# Patient Record
Sex: Female | Born: 1965 | Race: Black or African American | Hispanic: No | Smoking: Current every day smoker
Health system: Southern US, Community
[De-identification: ages and names within clinical notes are randomized; demographics above are authoritative.]

## PROBLEM LIST (undated history)

## (undated) DIAGNOSIS — K5792 Diverticulitis of intestine, part unspecified, without perforation or abscess without bleeding: Secondary | ICD-10-CM

## (undated) DIAGNOSIS — E119 Type 2 diabetes mellitus without complications: Secondary | ICD-10-CM

## (undated) DIAGNOSIS — I10 Essential (primary) hypertension: Secondary | ICD-10-CM

## (undated) HISTORY — PX: CHOLECYSTECTOMY: SHX55

---

## 2021-04-26 ENCOUNTER — Emergency Department (HOSPITAL_BASED_OUTPATIENT_CLINIC_OR_DEPARTMENT_OTHER)
Admission: EM | Admit: 2021-04-26 | Discharge: 2021-04-26 | Disposition: A | Discharge status: Home / Self Care | Payer: BC Managed Care – PPO | Attending: Emergency Medicine | Admitting: Emergency Medicine

## 2021-04-26 ENCOUNTER — Other Ambulatory Visit: Payer: Self-pay

## 2021-04-26 ENCOUNTER — Emergency Department (HOSPITAL_BASED_OUTPATIENT_CLINIC_OR_DEPARTMENT_OTHER): Payer: BC Managed Care – PPO

## 2021-04-26 ENCOUNTER — Encounter (HOSPITAL_BASED_OUTPATIENT_CLINIC_OR_DEPARTMENT_OTHER): Payer: Self-pay | Admitting: Emergency Medicine

## 2021-04-26 DIAGNOSIS — E119 Type 2 diabetes mellitus without complications: Secondary | ICD-10-CM | POA: Insufficient documentation

## 2021-04-26 DIAGNOSIS — F17210 Nicotine dependence, cigarettes, uncomplicated: Secondary | ICD-10-CM | POA: Diagnosis not present

## 2021-04-26 DIAGNOSIS — K5792 Diverticulitis of intestine, part unspecified, without perforation or abscess without bleeding: Secondary | ICD-10-CM

## 2021-04-26 DIAGNOSIS — K5732 Diverticulitis of large intestine without perforation or abscess without bleeding: Secondary | ICD-10-CM | POA: Insufficient documentation

## 2021-04-26 DIAGNOSIS — I10 Essential (primary) hypertension: Secondary | ICD-10-CM | POA: Insufficient documentation

## 2021-04-26 DIAGNOSIS — R1032 Left lower quadrant pain: Secondary | ICD-10-CM | POA: Diagnosis present

## 2021-04-26 HISTORY — DX: Essential (primary) hypertension: I10

## 2021-04-26 HISTORY — DX: Type 2 diabetes mellitus without complications: E11.9

## 2021-04-26 LAB — URINALYSIS, MICROSCOPIC (REFLEX)

## 2021-04-26 LAB — CBC WITH DIFFERENTIAL/PLATELET
Abs Immature Granulocytes: 0.01 10*3/uL (ref 0.00–0.07)
Basophils Absolute: 0 10*3/uL (ref 0.0–0.1)
Basophils Relative: 0 %
Eosinophils Absolute: 0 10*3/uL (ref 0.0–0.5)
Eosinophils Relative: 0 %
HCT: 41.7 % (ref 36.0–46.0)
Hemoglobin: 13.9 g/dL (ref 12.0–15.0)
Immature Granulocytes: 0 %
Lymphocytes Relative: 34 %
Lymphs Abs: 1.8 10*3/uL (ref 0.7–4.0)
MCH: 31.2 pg (ref 26.0–34.0)
MCHC: 33.3 g/dL (ref 30.0–36.0)
MCV: 93.7 fL (ref 80.0–100.0)
Monocytes Absolute: 0.4 10*3/uL (ref 0.1–1.0)
Monocytes Relative: 7 %
Neutro Abs: 3 10*3/uL (ref 1.7–7.7)
Neutrophils Relative %: 59 %
Platelets: 288 10*3/uL (ref 150–400)
RBC: 4.45 MIL/uL (ref 3.87–5.11)
RDW: 12.8 % (ref 11.5–15.5)
WBC: 5.2 10*3/uL (ref 4.0–10.5)
nRBC: 0 % (ref 0.0–0.2)

## 2021-04-26 LAB — COMPREHENSIVE METABOLIC PANEL
ALT: 7 U/L (ref 0–44)
AST: 18 U/L (ref 15–41)
Albumin: 3.9 g/dL (ref 3.5–5.0)
Alkaline Phosphatase: 54 U/L (ref 38–126)
Anion gap: 7 (ref 5–15)
BUN: 7 mg/dL (ref 6–20)
CO2: 27 mmol/L (ref 22–32)
Calcium: 9.4 mg/dL (ref 8.9–10.3)
Chloride: 107 mmol/L (ref 98–111)
Creatinine, Ser: 0.74 mg/dL (ref 0.44–1.00)
GFR, Estimated: >60 mL/min (ref >60)
Glucose, Bld: 92 mg/dL (ref 70–99)
Potassium: 4.2 mmol/L (ref 3.5–5.1)
Sodium: 141 mmol/L (ref 135–145)
Total Bilirubin: 0.8 mg/dL (ref 0.3–1.2)
Total Protein: 7.8 g/dL (ref 6.5–8.1)

## 2021-04-26 LAB — URINALYSIS, ROUTINE W REFLEX MICROSCOPIC
Bilirubin Urine: NEGATIVE
Glucose, UA: NEGATIVE mg/dL
Ketones, ur: NEGATIVE mg/dL
Leukocytes,Ua: NEGATIVE
Nitrite: NEGATIVE
Protein, ur: NEGATIVE mg/dL
Specific Gravity, Urine: 1.025 (ref 1.005–1.030)
pH: 5.5 (ref 5.0–8.0)

## 2021-04-26 LAB — LIPASE, BLOOD: Lipase: 34 U/L (ref 11–51)

## 2021-04-26 MED ORDER — HYDROCODONE-ACETAMINOPHEN 5-325 MG PO TABS: 1.0000 | ORAL_TABLET | ORAL | 0 refills | Status: AC | PRN

## 2021-04-26 MED ORDER — AMOXICILLIN-POT CLAVULANATE 875-125 MG PO TABS: 1.0000 | ORAL_TABLET | Freq: Two times a day (BID) | ORAL | 0 refills | Status: AC

## 2021-04-26 NOTE — Discharge Instructions (Addendum)
We discussed the results of your CT abdomen and pelvis on today's visit.  Please be aware the incidental findings that we discussed, you will need to have follow-up for the lung nodule found on today's visit.  Please continue with your gastroenterology follow-up, as recommended.  I did prescribe an antibiotic to help treat your infection, due to your allergy to Cipro.  We we will treated with 1 antibiotic, please take 1 tablet twice daily for the next 7 days.  I have also prescribed medication to help with severe pain, please be aware this medication can cause constipation, you will need to add a stool softener or MiraLAX daily while taking this medication.  If you experience any fever, nausea, vomiting, blood in your stools you will need to return to the emergency department.

## 2021-04-26 NOTE — ED Triage Notes (Signed)
Pt arrives pov with LLQ pain and diarrhea that started yesterday and hematuria. Pt referred to ED by pcp for CT scan. Pt denies fever or emesis, endorses nausea. Pt endorses 500mg  tylenol pta

## 2021-04-26 NOTE — ED Provider Notes (Signed)
[facility] EMERGENCY DEPARTMENT Provider Note   CSN: [id number] Arrival date & time: 04/26/21  1021     History Chief Complaint  Patient presents with   Abdominal Pain    Erin Pace is a 55 y.o. female.  55 y.o female with a PMH of DM, HTN presents to the ED with a chief complaint of left lower quadrant abdominal pain x yesterday.  Patient was evaluated at [facility] this morning, was sent here for further evaluation of her left lower quadrant pain that began yesterday.  Reports taking Tylenol yesterday for her pain which made the pain go from an 8 to "really like ".  Reports that when she woke up this morning it was not as bad.  She had similar symptoms about 10 years ago, unsure whether these were caused by any diverticulitis.  Endorses some nausea.  Also complains of some diarrhea, states today while she was doing a urine sample, had an episode of diarrhea again prior to arrival in the ED.  She has not had any emesis.  There has been no blood in her stool, no sick contacts, no fever, no shortness of breath or chest pain.  No urinary symptoms.      The history is provided by the patient.  Abdominal Pain Pain location:  LLQ Pain quality: burning   Pain radiates to:  Does not radiate Pain severity:  Severe Onset quality:  Sudden Duration:  1 day Timing:  Constant Progression:  Worsening Chronicity:  New Relieved by:  Acetaminophen Worsened by:  Palpation Associated symptoms: nausea   Associated symptoms: no chest pain, no chills, no fever, no shortness of breath, no sore throat and no vomiting       Past Medical History:  Diagnosis Date   Diabetes mellitus without complication (HCC)    Hypertension     There are no problems to display for this patient.   Past Surgical History:  Procedure Laterality Date   CHOLECYSTECTOMY       OB History   No obstetric history on file.     History reviewed. No pertinent family history.  Social  History   Tobacco Use   Smoking status: Every Day    Pack years: 0.00    Types: Cigarettes  Substance Use Topics   Alcohol use: Yes   Drug use: Never    Home Medications Prior to Admission medications   Medication Sig Start Date End Date Taking? Authorizing Provider  amoxicillin-clavulanate (AUGMENTIN) 875-125 MG tablet Take 1 tablet by mouth 2 (two) times daily for 7 days. 04/26/21 05/03/21 Yes Latika Kronick, Leonie Douglas, PA-C  HYDROcodone-acetaminophen (NORCO/VICODIN) 5-325 MG tablet Take 1 tablet by mouth every 4 (four) hours as needed for up to 3 days. 04/26/21 04/29/21 Yes Klyn Kroening, Leonie Douglas, PA-C    Allergies    Ciprofloxacin and Sulfa antibiotics  Review of Systems   Review of Systems  Constitutional:  Negative for chills and fever.  HENT:  Negative for sore throat.   Respiratory:  Negative for shortness of breath.   Cardiovascular:  Negative for chest pain.  Gastrointestinal:  Positive for abdominal pain and nausea. Negative for vomiting.  Genitourinary:  Negative for flank pain.  Musculoskeletal:  Negative for back pain.  Neurological:  Negative for light-headedness and headaches.  All other systems reviewed and are negative.  Physical Exam Updated Vital Signs BP (!) 149/94 (BP Location: Right Arm)   Pulse 73   Temp 98.6 F (37 C) (Oral)   Resp 18  Ht 5\' 2"  (1.575 m)   Wt 48.3 kg   SpO2 100%   BMI 19.46 kg/m   Physical Exam Vitals and nursing note reviewed.  Constitutional:      Appearance: She is well-developed.  HENT:     Head: Normocephalic and atraumatic.  Cardiovascular:     Rate and Rhythm: Normal rate.  Pulmonary:     Effort: Pulmonary effort is normal.     Breath sounds: No wheezing or rales.  Abdominal:     General: Abdomen is flat. Bowel sounds are normal.     Palpations: Abdomen is soft.     Tenderness: There is abdominal tenderness in the left lower quadrant. There is no left CVA tenderness.  Skin:    General: Skin is warm and dry.  Neurological:      Mental Status: She is alert and oriented to person, place, and time.    ED Results / Procedures / Treatments   Labs (all labs ordered are listed, but only abnormal results are displayed) Labs Reviewed  URINALYSIS, ROUTINE W REFLEX MICROSCOPIC - Abnormal; Notable for the following components:      Result Value   Hgb urine dipstick TRACE (*)    All other components within normal limits  URINALYSIS, MICROSCOPIC (REFLEX) - Abnormal; Notable for the following components:   Bacteria, UA RARE (*)    All other components within normal limits  CBC WITH DIFFERENTIAL/PLATELET  COMPREHENSIVE METABOLIC PANEL  LIPASE, BLOOD    EKG None  Radiology CT ABDOMEN PELVIS WO CONTRAST  Result Date: 04/26/2021 CLINICAL DATA:  LEFT lower quadrant pain EXAM: CT ABDOMEN AND PELVIS WITHOUT CONTRAST TECHNIQUE: Multidetector CT imaging of the abdomen and pelvis was performed following the standard protocol without IV contrast. COMPARISON:  None. FINDINGS: Evaluation is limited secondary lack of IV contrast. Lower chest: RIGHT lower lobe pulmonary nodule versus impacted mucus measures 8 by 4 mm (series 4, image 3). Coronary artery atherosclerotic calcifications. Hepatobiliary: Unremarkable noncontrast appearance of the liver. Status post cholecystectomy. Prominence of the common bile duct likely due to post cholecystectomy state. Pancreas: No peripancreatic fat stranding. There are several scattered punctate calcifications within the pancreas which may reflect the sequela of prior pancreatitis. Spleen: Unremarkable. Adrenals/Urinary Tract: There is a 22 mm RIGHT adrenal nodule which demonstrates a Hounsfield unit of 36, indeterminate. LEFT adrenal gland is unremarkable. Punctate nonobstructive RIGHT-sided nephrolithiasis. Intrinsically hyperdense LEFT renal 5 mm mass, likely a hemorrhagic or proteinaceous cyst. Bladder is decompressed. Stomach/Bowel: There is bowel wall thickening of the sigmoid colon adjacent to several  diverticuli. Extensive diverticulosis. No definitive evidence of free air or adjacent abscess formation. No evidence of bowel obstruction. Appendix is normal. Vascular/Lymphatic: Severe atherosclerotic calcifications. There is a mildly enlarged LEFT pelvic sidewall lymph node which measures 10 mm (series 2, image 55). There is an additional rounded LEFT external iliac lymph node which measures 8 mm (series 2, image 54). LEFT common iliac lymph node is mildly asymmetric to the RIGHT measures 7 mm (series 2, image 43). Reproductive: Coarsely calcified 17 mm LEFT pelvic sidewall mass which may reflect a calcified diverticulum versus adnexal lesion such as a dermoid or postsurgical changes given history of ipsilateral oophorectomy. Other: Small volume free fluid. Musculoskeletal: Bilateral assimilation joints at L5-S1. IMPRESSION: 1. Constellation of findings are consistent with acute uncomplicated diverticulitis. Recommend correlation with colon cancer screening history as patient may benefit from colonoscopy after resolution of acute symptomatology. 2. There are adjacent mildly enlarged retroperitoneal lymph nodes, likely reactive. 3. There is  an indeterminate 22 mm RIGHT adrenal nodule. Recommend nonemergent adrenal MRI to definitively characterize. 4. There is a RIGHT lower lobe 6 mm pulmonary nodule versus area of impacted mucus. Non-contrast chest CT at 6-12 months is recommended. If the nodule is stable at time of repeat CT, then future CT at 18-24 months (from today's scan) is considered optional for low-risk patients, but is recommended for high-risk patients. This recommendation follows the consensus statement: Guidelines for Management of Incidental Pulmonary Nodules Detected on CT Images: From the Fleischner Society 2017; Radiology 2017; 284:228-243. 5. Severe atherosclerotic calcifications. Coronary artery atherosclerotic calcifications. Aortic Atherosclerosis (ICD10-I70.0). Electronically Signed   By:  Meda Klinefelter MD   On: 04/26/2021 12:12    Procedures Procedures   Medications Ordered in ED Medications - No data to display  ED Course  I have reviewed the triage vital signs and the nursing notes.  Pertinent labs & imaging results that were available during my care of the patient were reviewed by me and considered in my medical decision making (see chart for details).    MDM Rules/Calculators/A&P                          Patient presents to the ED with a chief complaint of left lower quadrant pain times yesterday.  Seen at PCPs office today, sent in for CT scan rule out diverticulitis.  Primary evaluation she appears nontoxic, non-ill-appearing.  Abdomen is soft, mildly tender to palpation along the left lower quadrant without any guarding.  Lungs are clear to auscultation with no wheezing, rhonchi, rales.  Moves all upper and lower extremities.  No bilateral leg swelling, or pitting edema.  Interpretation of her blood work to be a CMP without any electrolyte derangement, current levels within normal limits.  LFTs are unremarkable, no pain along the right upper quadrant, have a low suspicion for cholecystitis with stable vital signs.  CBC without any leukocytosis, hemoglobin is within normal limits.  Denies any blood in her stool.  Lipase level is normal.  No history of alcohol abuse, no pain along the left upper quadrant.  UA with trace of hemoglobin, no nitrates leukocytes, denies any urinary symptoms on today's visit.  Suspicion for diverticulitis, does have prior colonoscopies, where she had polyps removed, no prior history of colon cancer in her or her family.  She does endorse tobacco smoking.   CT Abdomen and pelvis:  1. Constellation of findings are consistent with acute uncomplicated  diverticulitis. Recommend correlation with colon cancer screening  history as patient may benefit from colonoscopy after resolution of  acute symptomatology.  2. There are adjacent mildly  enlarged retroperitoneal lymph nodes,  likely reactive.  3. There is an indeterminate 22 mm RIGHT adrenal nodule. Recommend  nonemergent adrenal MRI to definitively characterize.  4. There is a RIGHT lower lobe 6 mm pulmonary nodule versus area of  impacted mucus. Non-contrast chest CT at 6-12 months is recommended.  If the nodule is stable at time of repeat CT, then future CT at  18-24 months (from today's scan) is considered optional for low-risk  patients, but is recommended for high-risk patients. This  recommendation follows the consensus statement: Guidelines for  Management of Incidental Pulmonary Nodules Detected on CT Images:  From the Fleischner Society 2017; Radiology 2017; 284:228-243.  5. Severe atherosclerotic calcifications. Coronary artery  atherosclerotic calcifications.     Aortic Atherosclerosis (ICD10-I70.0).         These results were discussed  with him at length, she does have a history of smoking cigarettes, we did discuss appropriate follow-up for her pulmonary nodule.  Incidental finding of adrenal gland was also discussed.  We discussed CT images at this time.  She is currently not febrile, without any white count I feel that we could try oral antibiotics on today's visit.She does endorse prior allergy to Cipro, I have consulted the pharmacist on-call in order to obtain a better antibiotic for patient at this time.  Patient will go home on Augmentin to help treat her infection, I also provided her with a prescription for Norco for pain control, she is to add a stool softener to this.  Patient remains in stable condition, stable for discharge.  Return precautions discussed at length   Portions of this note were generated with Dragon dictation software. Dictation errors may occur despite best attempts at proofreading.  Final Clinical Impression(s) / ED Diagnoses Final diagnoses:  Diverticulitis    Rx / DC Orders ED Discharge Orders          Ordered     amoxicillin-clavulanate (AUGMENTIN) 875-125 MG tablet  2 times daily        04/26/21 1349    HYDROcodone-acetaminophen (NORCO/VICODIN) 5-325 MG tablet  Every 4 hours PRN        04/26/21 1353             Claude MangesSoto, Eragon Hammond, PA-C 04/26/21 1406    Pricilla LovelessGoldston, Scott, MD 04/27/21 [phone]

## 2021-09-25 ENCOUNTER — Encounter (HOSPITAL_BASED_OUTPATIENT_CLINIC_OR_DEPARTMENT_OTHER): Payer: Self-pay | Admitting: *Deleted

## 2021-09-25 ENCOUNTER — Emergency Department (HOSPITAL_BASED_OUTPATIENT_CLINIC_OR_DEPARTMENT_OTHER): Payer: BC Managed Care – PPO

## 2021-09-25 ENCOUNTER — Emergency Department (HOSPITAL_BASED_OUTPATIENT_CLINIC_OR_DEPARTMENT_OTHER)
Admission: EM | Admit: 2021-09-25 | Discharge: 2021-09-25 | Disposition: A | Discharge status: Home / Self Care | Payer: BC Managed Care – PPO | Attending: Emergency Medicine | Admitting: Emergency Medicine

## 2021-09-25 ENCOUNTER — Other Ambulatory Visit: Payer: Self-pay

## 2021-09-25 DIAGNOSIS — M7989 Other specified soft tissue disorders: Secondary | ICD-10-CM | POA: Diagnosis present

## 2021-09-25 DIAGNOSIS — E119 Type 2 diabetes mellitus without complications: Secondary | ICD-10-CM | POA: Diagnosis not present

## 2021-09-25 DIAGNOSIS — T80818A Extravasation of other vesicant agent, initial encounter: Secondary | ICD-10-CM

## 2021-09-25 DIAGNOSIS — Y828 Other medical devices associated with adverse incidents: Secondary | ICD-10-CM | POA: Insufficient documentation

## 2021-09-25 DIAGNOSIS — F17210 Nicotine dependence, cigarettes, uncomplicated: Secondary | ICD-10-CM | POA: Insufficient documentation

## 2021-09-25 DIAGNOSIS — I10 Essential (primary) hypertension: Secondary | ICD-10-CM | POA: Insufficient documentation

## 2021-09-25 MED ORDER — ACETAMINOPHEN 325 MG PO TABS
650.0000 mg | ORAL_TABLET | Freq: Once | ORAL | Status: AC
  Administered 2021-09-25: 650 mg via ORAL
  Filled 2021-09-25: qty 2

## 2021-09-25 MED ORDER — IBUPROFEN 800 MG PO TABS
800.0000 mg | ORAL_TABLET | Freq: Once | ORAL | Status: AC
  Administered 2021-09-25: 800 mg via ORAL
  Filled 2021-09-25: qty 1

## 2021-09-25 NOTE — ED Triage Notes (Signed)
She had a CT scan today with IV contrast. Swelling to her left arm. Pt feels her IV was in the skin and not her vein.

## 2021-09-25 NOTE — ED Provider Notes (Signed)
[facility] EMERGENCY DEPARTMENT Provider Note   CSN: [id number] Arrival date & time: 09/25/21  1509     History Chief Complaint  Patient presents with   Arm Swelling    Erin Pace is a 55 y.o. female with no significant past medical history who presents after being seen for an outpatient IV CT earlier today.  Patient believes that there was an infiltration of the contrast media into her left arm, as she is having some significant pain, swelling since the event, despite ice, rest, elevation of the affected extremity.  Patient reports the pain is 7/10.  She has not taken anything for pain prior to arrival.  She has no numbness or tingling of the fingers or hand on the left side.  HPI     Past Medical History:  Diagnosis Date   Diabetes mellitus without complication (HCC)    Hypertension     There are no problems to display for this patient.   Past Surgical History:  Procedure Laterality Date   CHOLECYSTECTOMY       OB History   No obstetric history on file.     No family history on file.  Social History   Tobacco Use   Smoking status: Every Day    Types: Cigarettes   Smokeless tobacco: Never  Vaping Use   Vaping Use: Never used  Substance Use Topics   Alcohol use: Yes   Drug use: Never    Home Medications Prior to Admission medications   Not on File    Allergies    Ciprofloxacin and Sulfa antibiotics  Review of Systems   Review of Systems  Musculoskeletal:  Positive for joint swelling.  All other systems reviewed and are negative.  Physical Exam Updated Vital Signs BP (!) 181/100 (BP Location: Right Arm)   Pulse 94   Temp 99.4 F (37.4 C) (Oral)   Resp 18   Ht 5\' 2"  (1.575 m)   Wt 46.9 kg   SpO2 98%   BMI 18.93 kg/m   Physical Exam Vitals and nursing note reviewed.  Constitutional:      General: She is not in acute distress.    Appearance: Normal appearance.  HENT:     Head: Normocephalic and atraumatic.  Eyes:      General:        Right eye: No discharge.        Left eye: No discharge.  Cardiovascular:     Rate and Rhythm: Normal rate and regular rhythm.     Pulses: Normal pulses.     Comments: Intact radial and ulnar pulses of left hand, intact capillary refill distal to area of swelling. Pulmonary:     Effort: Pulmonary effort is normal. No respiratory distress.  Musculoskeletal:        General: No deformity.  Skin:    General: Skin is warm and dry.     Comments: Somewhat swollen left compared to right arm without redness.  There is some significant tenderness to palpation in the area of swelling.  No focal fluid collection, or area of fluctuance.  Skin is not particularly warm compared to contralateral side.  Neurological:     Mental Status: She is alert and oriented to person, place, and time.  Psychiatric:        Mood and Affect: Mood normal.        Behavior: Behavior normal.    ED Results / Procedures / Treatments   Labs (all labs ordered are listed, but  only abnormal results are displayed) Labs Reviewed - No data to display  EKG None  Radiology DG Forearm Left  Result Date: 09/25/2021 CLINICAL DATA:  Swelling and pain LEFT forearm, had CT with IV contrast performed earlier today, question extravasation EXAM: LEFT FOREARM - 2 VIEW COMPARISON:  None FINDINGS: Osseous mineralization normal. Joint spaces preserved. No acute fracture, dislocation, or bone destruction. Soft tissue swelling is seen diffusely about the LEFT elbow and anterior proximal forearm region. Portions of the soft tissue edema are higher attenuation consistent with extravasated IV contrast. IMPRESSION: Soft tissue swelling at LEFT elbow and proximal forearm with portions of the edema higher in attenuation consistent with a small amount of contrast extravasation. Electronically Signed   By: Ulyses Southward M.D.   On: 09/25/2021 16:28    Procedures Procedures   Medications Ordered in ED Medications  acetaminophen  (TYLENOL) tablet 650 mg (650 mg Oral Given 09/25/21 1605)  ibuprofen (ADVIL) tablet 800 mg (800 mg Oral Given 09/25/21 1605)    ED Course  I have reviewed the triage vital signs and the nursing notes.  Pertinent labs & imaging results that were available during my care of the patient were reviewed by me and considered in my medical decision making (see chart for details).    MDM Rules/Calculators/A&P                         I discussed this case with my attending physician who cosigned this note including patient's presenting symptoms, physical exam, and planned diagnostics and interventions. Attending physician stated agreement with plan or made changes to plan which were implemented.   Patient presents with concern for possible contrast extravasation into left arm after receiving IV contrast earlier today for CT.  Patient reports that the contrast was discontinued when she started experiencing pain.  Patient reports that she elevated the arm, applied ice without resolution of symptoms.  Radiographic imaging does reveal slightly higher attenuation consistent with possible extravasation.  Treatment at this time is supportive care, elevation, ice, ibuprofen Tylenol as needed for pain.  Discussed results with patient who understands.  Return precautions given patient discharged in stable condition. Final Clinical Impression(s) / ED Diagnoses Final diagnoses:  Extravasation of intravenous contrast medium    Rx / DC Orders ED Discharge Orders     None        Olene Floss, PA-C 09/25/21 1710    Melene Plan, DO 09/25/21 1845

## 2021-09-25 NOTE — Discharge Instructions (Addendum)
As we discussed your radiographic imaging showed "portions of edema higher in attenuation consistent with a small amount of contrast extravasation". The treatment is supportive care, ice, elevation, and tylenol, ibuprofen as needed for pain. Please follow up as needed if the swelling and pain fails to resolve.

## 2021-11-14 ENCOUNTER — Encounter (HOSPITAL_BASED_OUTPATIENT_CLINIC_OR_DEPARTMENT_OTHER): Payer: Self-pay | Admitting: Emergency Medicine

## 2021-11-14 ENCOUNTER — Emergency Department (HOSPITAL_BASED_OUTPATIENT_CLINIC_OR_DEPARTMENT_OTHER): Payer: BC Managed Care – PPO

## 2021-11-14 ENCOUNTER — Emergency Department (HOSPITAL_BASED_OUTPATIENT_CLINIC_OR_DEPARTMENT_OTHER)
Admission: EM | Admit: 2021-11-14 | Discharge: 2021-11-14 | Disposition: A | Discharge status: Home / Self Care | Payer: BC Managed Care – PPO | Attending: Emergency Medicine | Admitting: Emergency Medicine

## 2021-11-14 ENCOUNTER — Other Ambulatory Visit: Payer: Self-pay

## 2021-11-14 DIAGNOSIS — F17210 Nicotine dependence, cigarettes, uncomplicated: Secondary | ICD-10-CM | POA: Diagnosis not present

## 2021-11-14 DIAGNOSIS — M25511 Pain in right shoulder: Secondary | ICD-10-CM | POA: Diagnosis not present

## 2021-11-14 DIAGNOSIS — R519 Headache, unspecified: Secondary | ICD-10-CM | POA: Insufficient documentation

## 2021-11-14 DIAGNOSIS — E119 Type 2 diabetes mellitus without complications: Secondary | ICD-10-CM | POA: Diagnosis not present

## 2021-11-14 DIAGNOSIS — Y92410 Unspecified street and highway as the place of occurrence of the external cause: Secondary | ICD-10-CM | POA: Diagnosis not present

## 2021-11-14 DIAGNOSIS — M25512 Pain in left shoulder: Secondary | ICD-10-CM | POA: Insufficient documentation

## 2021-11-14 DIAGNOSIS — M542 Cervicalgia: Secondary | ICD-10-CM | POA: Insufficient documentation

## 2021-11-14 DIAGNOSIS — M545 Low back pain, unspecified: Secondary | ICD-10-CM | POA: Insufficient documentation

## 2021-11-14 DIAGNOSIS — R030 Elevated blood-pressure reading, without diagnosis of hypertension: Secondary | ICD-10-CM

## 2021-11-14 DIAGNOSIS — I10 Essential (primary) hypertension: Secondary | ICD-10-CM | POA: Diagnosis not present

## 2021-11-14 MED ORDER — NAPROXEN 375 MG PO TABS: 375.0000 mg | ORAL_TABLET | Freq: Two times a day (BID) | ORAL | 0 refills | Status: AC

## 2021-11-14 MED ORDER — CYCLOBENZAPRINE HCL 10 MG PO TABS: 5.0000 mg | ORAL_TABLET | Freq: Two times a day (BID) | ORAL | 0 refills | Status: AC | PRN

## 2021-11-14 NOTE — Discharge Instructions (Signed)
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
## 2021-11-14 NOTE — ED Notes (Signed)
Signature pad not working. 

## 2022-05-23 ENCOUNTER — Encounter (HOSPITAL_COMMUNITY): Payer: Self-pay | Admitting: Radiology

## 2022-07-25 ENCOUNTER — Emergency Department (HOSPITAL_BASED_OUTPATIENT_CLINIC_OR_DEPARTMENT_OTHER)
Admission: EM | Admit: 2022-07-25 | Discharge: 2022-07-25 | Disposition: A | Discharge status: Home / Self Care | Payer: BC Managed Care – PPO | Attending: Emergency Medicine | Admitting: Emergency Medicine

## 2022-07-25 ENCOUNTER — Emergency Department (HOSPITAL_BASED_OUTPATIENT_CLINIC_OR_DEPARTMENT_OTHER): Payer: BC Managed Care – PPO

## 2022-07-25 ENCOUNTER — Other Ambulatory Visit: Payer: Self-pay

## 2022-07-25 ENCOUNTER — Encounter (HOSPITAL_BASED_OUTPATIENT_CLINIC_OR_DEPARTMENT_OTHER): Payer: Self-pay | Admitting: Emergency Medicine

## 2022-07-25 DIAGNOSIS — R911 Solitary pulmonary nodule: Secondary | ICD-10-CM

## 2022-07-25 DIAGNOSIS — A599 Trichomoniasis, unspecified: Secondary | ICD-10-CM

## 2022-07-25 DIAGNOSIS — K5732 Diverticulitis of large intestine without perforation or abscess without bleeding: Secondary | ICD-10-CM | POA: Diagnosis not present

## 2022-07-25 DIAGNOSIS — T80818A Extravasation of other vesicant agent, initial encounter: Secondary | ICD-10-CM | POA: Insufficient documentation

## 2022-07-25 DIAGNOSIS — R1032 Left lower quadrant pain: Secondary | ICD-10-CM | POA: Diagnosis present

## 2022-07-25 LAB — CBC WITH DIFFERENTIAL/PLATELET
Abs Immature Granulocytes: 0.03 10*3/uL (ref 0.00–0.07)
Basophils Absolute: 0 10*3/uL (ref 0.0–0.1)
Basophils Relative: 0 %
Eosinophils Absolute: 0 10*3/uL (ref 0.0–0.5)
Eosinophils Relative: 1 %
HCT: 39.8 % (ref 36.0–46.0)
Hemoglobin: 13.4 g/dL (ref 12.0–15.0)
Immature Granulocytes: 0 %
Lymphocytes Relative: 18 %
Lymphs Abs: 1.6 10*3/uL (ref 0.7–4.0)
MCH: 30.5 pg (ref 26.0–34.0)
MCHC: 33.7 g/dL (ref 30.0–36.0)
MCV: 90.5 fL (ref 80.0–100.0)
Monocytes Absolute: 0.5 10*3/uL (ref 0.1–1.0)
Monocytes Relative: 6 %
Neutro Abs: 6.7 10*3/uL (ref 1.7–7.7)
Neutrophils Relative %: 75 %
Platelets: 268 10*3/uL (ref 150–400)
RBC: 4.4 MIL/uL (ref 3.87–5.11)
RDW: 12.9 % (ref 11.5–15.5)
WBC: 8.9 10*3/uL (ref 4.0–10.5)
nRBC: 0 % (ref 0.0–0.2)

## 2022-07-25 LAB — COMPREHENSIVE METABOLIC PANEL
ALT: 12 U/L (ref 0–44)
AST: 17 U/L (ref 15–41)
Albumin: 3.7 g/dL (ref 3.5–5.0)
Alkaline Phosphatase: 63 U/L (ref 38–126)
Anion gap: 8 (ref 5–15)
BUN: 12 mg/dL (ref 6–20)
CO2: 23 mmol/L (ref 22–32)
Calcium: 9.4 mg/dL (ref 8.9–10.3)
Chloride: 110 mmol/L (ref 98–111)
Creatinine, Ser: 0.82 mg/dL (ref 0.44–1.00)
GFR, Estimated: >60 mL/min (ref >60)
Glucose, Bld: 191 mg/dL — ABNORMAL HIGH (ref 70–99)
Potassium: 3.3 mmol/L — ABNORMAL LOW (ref 3.5–5.1)
Sodium: 141 mmol/L (ref 135–145)
Total Bilirubin: 0.5 mg/dL (ref 0.3–1.2)
Total Protein: 7.6 g/dL (ref 6.5–8.1)

## 2022-07-25 LAB — PREGNANCY, URINE: Preg Test, Ur: NEGATIVE

## 2022-07-25 LAB — URINALYSIS, MICROSCOPIC (REFLEX)

## 2022-07-25 LAB — URINALYSIS, ROUTINE W REFLEX MICROSCOPIC
Bilirubin Urine: NEGATIVE
Glucose, UA: NEGATIVE mg/dL
Hgb urine dipstick: NEGATIVE
Ketones, ur: NEGATIVE mg/dL
Leukocytes,Ua: NEGATIVE
Nitrite: NEGATIVE
Protein, ur: 30 mg/dL — AB
Specific Gravity, Urine: >=1.03 (ref 1.005–1.030)
pH: 5.5 (ref 5.0–8.0)

## 2022-07-25 LAB — LIPASE, BLOOD: Lipase: 44 U/L (ref 11–51)

## 2022-07-25 MED ORDER — METRONIDAZOLE 500 MG PO TABS
500.0000 mg | ORAL_TABLET | Freq: Once | ORAL | Status: AC
  Administered 2022-07-25: 500 mg via ORAL
  Filled 2022-07-25: qty 1

## 2022-07-25 MED ORDER — CEFDINIR 300 MG PO CAPS
300.0000 mg | ORAL_CAPSULE | Freq: Two times a day (BID) | ORAL | Status: DC
  Filled 2022-07-25: qty 1

## 2022-07-25 MED ORDER — IOHEXOL 300 MG/ML  SOLN
75.0000 mL | Freq: Once | INTRAMUSCULAR | Status: AC | PRN
  Administered 2022-07-25: 75 mL via INTRAVENOUS

## 2022-07-25 MED ORDER — CEFDINIR 300 MG PO CAPS
300.0000 mg | ORAL_CAPSULE | Freq: Once | ORAL | Status: AC
Start: 2022-07-25 — End: 2022-07-25
  Administered 2022-07-25: 300 mg via ORAL

## 2022-07-25 MED ORDER — DICYCLOMINE HCL 20 MG PO TABS: 20.0000 mg | ORAL_TABLET | Freq: Two times a day (BID) | ORAL | 0 refills | Status: AC

## 2022-07-25 MED ORDER — CEFDINIR 300 MG PO CAPS: 300.0000 mg | ORAL_CAPSULE | Freq: Two times a day (BID) | ORAL | 0 refills | Status: DC

## 2022-07-25 MED ORDER — CEFDINIR 300 MG PO CAPS: 300.0000 mg | ORAL_CAPSULE | Freq: Two times a day (BID) | ORAL | Status: DC

## 2022-07-25 MED ORDER — ONDANSETRON HCL 4 MG/2ML IJ SOLN
4.0000 mg | Freq: Once | INTRAMUSCULAR | Status: AC
  Administered 2022-07-25: 4 mg via INTRAVENOUS
  Filled 2022-07-25: qty 2

## 2022-07-25 MED ORDER — METRONIDAZOLE 500 MG PO TABS: 500.0000 mg | ORAL_TABLET | Freq: Two times a day (BID) | ORAL | 0 refills | Status: DC

## 2022-07-25 MED ORDER — MORPHINE SULFATE (PF) 4 MG/ML IV SOLN
4.0000 mg | Freq: Once | INTRAVENOUS | Status: AC
  Administered 2022-07-25: 4 mg via INTRAVENOUS
  Filled 2022-07-25: qty 1

## 2022-07-25 MED ORDER — CEFPODOXIME PROXETIL 200 MG PO TABS: 200.0000 mg | ORAL_TABLET | Freq: Two times a day (BID) | ORAL | 0 refills | Status: DC

## 2022-07-25 NOTE — ED Notes (Signed)
Patient has full movement in arm. Patient states that the pain is gone . States that the arm is a little stiff

## 2022-07-25 NOTE — ED Notes (Addendum)
Lower pain cramping on left side  also across entire abdominal States left side is swollen Denies any n/v Denies any issues with bowels Denies any dysuria

## 2022-07-25 NOTE — Progress Notes (Signed)
1635 Pt had a contrast infiltration in Left Ac IV, with 75 ml of Isovue 300, followed by 15 ml of NS. Heat immediately applied, IV removed, arm elevated, PA Sage and RN [employer] notified. SZP entered, OP care instructions given to patient.

## 2022-07-25 NOTE — ED Notes (Signed)
Patient has heat pack on her left arm . Arm has little swelling. Patient and able to move her arm . She has full range of motion

## 2022-07-25 NOTE — ED Notes (Signed)
Rn Started Iv on Patient so that she could get a ct scan . Iv flushed well. And medication was given. Patient did not report any discomfort. Patient went to Ct and the ct staff states that the iv infiltrated." Patient states that they pushed NS throught line then they went to push the contrast and it would not go so she asked them could they stop trying to push the contrast because it hurt. Patient states they tried to pull the line out then reinsert the cather back in and proceeded to push the contrast again. She states that she stated that it still hurt and at that point  her began to swell. Ct states that they only pull the line back and inserted 10 ml . Patient arm was swollen when she returned to the room. Heat pack was applied. Patient has full movement in arm . States that her arm fill a little tight. . Swelling has went down alot . Patient denies any pain. Patient verbalized that she did not fell any pain when  rn administered medication thought the iv before she went to ct. States that she did  not have discomfort until she was gettig the contrast after the cather had been pull down and pushed back up in her arm

## 2022-07-25 NOTE — ED Provider Notes (Signed)
[facility] EMERGENCY DEPARTMENT Provider Note   CSN: [id number] Arrival date & time: 07/25/22  1332     History  Chief Complaint  Patient presents with   Abdominal Pain    Erin Pace is a 56 y.o. female.   Abdominal Pain    Patient presents due to left lower quadrant abdominal pain.  It started a week ago, its constant feels like pressure.  The pain moves across her lower abdomen in a bandlike distribution occasionally to the right, does not radiate to the back or upper chest.  She is nauseated but denies any vomiting.  Denies any vaginal bleeding, previous surgeries to her abdomen.  She has a history of diverticulitis and this feels very similar.    Denies any vaginal pain or pelvic pain.  States she has not been sexually active in almost a year.  History of cholecystectomy, denies any other abdominal surgeries.  Home Medications Prior to Admission medications   Medication Sig Start Date End Date Taking? Authorizing Provider  cefdinir (OMNICEF) 300 MG capsule Take 1 capsule (300 mg total) by mouth 2 (two) times daily. 07/25/22  Yes Theron Arista, PA-C  dicyclomine (BENTYL) 20 MG tablet Take 1 tablet (20 mg total) by mouth 2 (two) times daily. 07/25/22  Yes Theron Arista, PA-C  metroNIDAZOLE (FLAGYL) 500 MG tablet Take 1 tablet (500 mg total) by mouth 2 (two) times daily. 07/25/22  Yes Theron Arista, PA-C  cyclobenzaprine (FLEXERIL) 10 MG tablet Take 0.5-1 tablets (5-10 mg total) by mouth 2 (two) times daily as needed for muscle spasms. 11/14/21   Arthor Captain, PA-C  naproxen (NAPROSYN) 375 MG tablet Take 1 tablet (375 mg total) by mouth 2 (two) times daily with a meal. 11/14/21   Arthor Captain, PA-C      Allergies    Ciprofloxacin and Sulfa antibiotics    Review of Systems   Review of Systems  Gastrointestinal:  Positive for abdominal pain.    Physical Exam Updated Vital Signs BP (!) 186/89 (BP Location: Right Arm)   Pulse 71   Temp 98.6 F (37 C) (Oral)    Resp 18   Ht 5\' 2"  (1.575 m)   Wt 54.4 kg   SpO2 100%   BMI 21.95 kg/m  Physical Exam Vitals and nursing note reviewed. Exam conducted with a chaperone present.  Constitutional:      Appearance: Normal appearance.  HENT:     Head: Normocephalic and atraumatic.  Eyes:     General: No scleral icterus.       Right eye: No discharge.        Left eye: No discharge.     Extraocular Movements: Extraocular movements intact.     Pupils: Pupils are equal, round, and reactive to light.  Cardiovascular:     Rate and Rhythm: Normal rate and regular rhythm.     Pulses: Normal pulses.     Heart sounds: Normal heart sounds. No murmur heard.    No friction rub. No gallop.  Pulmonary:     Effort: Pulmonary effort is normal. No respiratory distress.     Breath sounds: Normal breath sounds.  Abdominal:     General: Abdomen is flat. Bowel sounds are normal. There is no distension.     Palpations: Abdomen is soft.     Tenderness: There is abdominal tenderness in the right lower quadrant, suprapubic area and left lower quadrant. There is no guarding.  Skin:    General: Skin is warm and dry.  Coloration: Skin is not jaundiced.  Neurological:     Mental Status: She is alert. Mental status is at baseline.     Coordination: Coordination normal.    RUE after IV extravasion at time of DC    ED Results / Procedures / Treatments   Labs (all labs ordered are listed, but only abnormal results are displayed) Labs Reviewed  COMPREHENSIVE METABOLIC PANEL - Abnormal; Notable for the following components:      Result Value   Potassium 3.3 (*)    Glucose, Bld 191 (*)    All other components within normal limits  URINALYSIS, ROUTINE W REFLEX MICROSCOPIC - Abnormal; Notable for the following components:   Protein, ur 30 (*)    All other components within normal limits  URINALYSIS, MICROSCOPIC (REFLEX) - Abnormal; Notable for the following components:   Bacteria, UA FEW (*)    Trichomonas, UA  PRESENT (*)    All other components within normal limits  LIPASE, BLOOD  PREGNANCY, URINE  CBC WITH DIFFERENTIAL/PLATELET    EKG None  Radiology CT Abdomen Pelvis W Contrast  Result Date: 07/25/2022 CLINICAL DATA:  Left lower quadrant abdominal pain x1 week. Progressively worsening. EXAM: CT ABDOMEN AND PELVIS WITH CONTRAST TECHNIQUE: Multidetector CT imaging of the abdomen and pelvis was performed using the standard protocol following bolus administration of intravenous contrast. RADIATION DOSE REDUCTION: This exam was performed according to the departmental dose-optimization program which includes automated exposure control, adjustment of the mA and/or kV according to patient size and/or use of iterative reconstruction technique. CONTRAST:  65mL OMNIPAQUE IOHEXOL 300 MG/ML  SOLN COMPARISON:  Multiple priors including CT September 25 2021 MRI May 22, 2021 and CT April 26, 2021. FINDINGS: Lower chest: Stable 5 mm right lower lobe pulmonary nodule, likely benign. No acute abnormality. Small hiatal hernia. Hepatobiliary: Unremarkable noncontrast enhanced appearance of the hepatic parenchyma. Gallbladder surgically absent. Mild prominence of the biliary tree is similar prior and favored reservoir effect post cholecystectomy. Pancreas: No pancreatic ductal dilation or evidence of acute inflammation. Spleen: No splenomegaly. Adrenals/Urinary Tract: Stable 2 cm right adrenal nodule previously characterized as a benign adenoma on CT September 25, 2021 and requiring no additional imaging follow-up. Left adrenal gland appears normal. Nonobstructive right renal stones measures 2-3 mm on image 32/2. No obstructive ureteral or bladder calculi identified. Hypodense subcentimeter left interpolar renal lesion is consistent with a benign hemorrhagic/proteinaceous cyst and requires no independent imaging follow-up. Urinary bladder is unremarkable for degree of distension. Stomach/Bowel: No radiopaque enteric contrast  material was administered. Small hiatal hernia. Otherwise, the stomach is unremarkable for degree of distension. No pathologic dilation of small or large bowel. The appendix and terminal ileum appear normal. Sigmoid colonic diverticulosis with acute diverticulitis. Vascular/Lymphatic: Aortic atherosclerosis. Prominent left iliac side chain lymph nodes measure up to 6 mm, favored reactive. Reproductive: Uterus and bilateral adnexa are unremarkable. Other: No walled off fluid collections. Musculoskeletal: No acute osseous abnormality. IMPRESSION: 1. Acute uncomplicated sigmoid diverticulitis. Recommend correlation with history of colon cancer screening history as patient would likely benefit from further evaluation with colonoscopy given the degree of wall thickening, upon resolution of acute symptomatology. 2. Nonobstructive right renal stones. 3. Stable likely benign 5 mm right lower lobe pulmonary nodule. Consider further evaluation with dedicated chest CT in 12 months to assess stability. 4.  Aortic Atherosclerosis (ICD10-I70.0). Electronically Signed   By: Maudry Mayhew M.D.   On: 07/25/2022 16:59    Procedures Procedures    Medications Ordered in ED Medications  ondansetron [facility]) injection 4 mg (4 mg Intravenous Given 07/25/22 1618)  morphine (PF) 4 MG/ML injection 4 mg (4 mg Intravenous Given 07/25/22 1619)  iohexol (OMNIPAQUE) 300 MG/ML solution 75 mL (75 mLs Intravenous Contrast Given 07/25/22 1626)  metroNIDAZOLE (FLAGYL) tablet 500 mg (500 mg Oral Given 07/25/22 1719)  cefdinir (OMNICEF) capsule 300 mg (300 mg Oral Given 07/25/22 1728)    ED Course/ Medical Decision Making/ A&P Clinical Course as of 07/25/22 2351  Sun Jul 25, 2022  1654 Unfortunately, during the CT scan patient IV extravasation.  We will monitor for the next 2 to 4 hours based on guidelines.  Patient is elevating arm with warm pads, the pain is well controlled.  Her abdominal pain is actually improved from a 3 out of 10  after the morphine and she is no longer nauseated.  We will continue to observe. [HS]  1932 Patient is requesting to leave.  Given the upper extremity has not increased in swelling, there is no altered sensation, no decreased pulses distal to the site, no blistering or ulceration of the skin and the cap refill is unchanged I do think it is appropriate for her to leave.  She still has some sensation of tightness but states it is not severe and has been constant.  She verbalized understanding with strict return precautions, we will let her leave after almost 3 hours of observation [HS]    Clinical Course User Index [HS] Theron Arista, PA-C                           Medical Decision Making Amount and/or Complexity of Data Reviewed Labs: ordered. Radiology: ordered.  Risk Prescription drug management.   Patient patient presents due to left lower quadrant abdominal pain.  Differential includes but is not limited to diverticulitis, UTI, pyelonephritis, SBO, ovarian torsion, ovarian cyst.  I viewed external records.  See HPI for previous surgery and comorbidities.  I viewed home medicine.  I also ordered Zofran, morphine, Omnicef and Flagyl  On exam patient has left lower quadrant abdominal pain as well as suprapubic and right lower quadrant pain.  There is no rebound or guarding, lungs are clear to auscultation bilaterally. -BP (!) 186/89 (BP Location: Right Arm)   Pulse 71   Temp 98.6 F (37 C) (Oral)   Resp 18   Ht 5\' 2"  (1.575 m)   Wt 54.4 kg   SpO2 100%   BMI 21.95 kg/m   I ordered, viewed and interpreted laboratory work-up. CBC without leukocytosis or anemia. Lipase is negative, consistent with pancreatitis. CMP without gross electrolyte derangement or AKI.  Glucose is slightly elevated at 191, K slightly decreased at 3.3 UA is negative for UTI. Interestingly patient is positive for trichomoniasis.  CT ordered and reviewed by myself.  Agree with radiologist interpretation.  Per  my interpretation patient has uncomplicated sigmoid diverticulitis, there is increased wall thickening.  Discussed this result with the patient, she states she had a colonoscopy yesterday which was unremarkable.  She is aware that she will need to repeat colonoscopy to make sure diverticulitis resolved and as part of colon cancer screening.  There is also a 5 mm nodule in her lung, she is aware of this and states it was there previously does not appear to be growing.  She will continue to follow-up with her PCP regarding this finding.  We will start patient on Flagyl and Omnicef for antibiotics.  She is allergic to  ciprofloxacin, [place]ward is appropriate discussed with my attending.  Flagyl will cover the trichomoniasis as well as the diverticulitis and the Omnicef will provide additional coverage.  Strict return precautions were discussed the patient especially regarding the extubation of the IV contrast.  She verbalized understanding and agreement with plan.  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         Final Clinical Impression(s) / ED Diagnoses Final diagnoses:  Diverticulitis of colon  Trichomoniasis  Lung nodule seen on imaging study  Extravasation accident, initial encounter    Rx / DC Orders ED Discharge Orders          Ordered    metroNIDAZOLE (FLAGYL) 500 MG tablet  2 times daily        07/25/22 1712    cefpodoxime (VANTIN) 200 MG tablet  2 times daily,   Status:  Discontinued        07/25/22 1712    cefdinir (OMNICEF) 300 MG capsule  2 times daily        07/25/22 1713    dicyclomine (BENTYL) 20 MG tablet  2 times daily        07/25/22 1945              Theron Arista, Cordelia Poche 07/25/22 2352    Lonell Grandchild, MD 07/26/22 1311

## 2022-07-25 NOTE — Discharge Instructions (Addendum)
1 -diverticulitis: You have diverticulitis, take the Flagyl twice daily and the cefdinir twice daily for the next 7 days.  Do not eat or drink anything for the next 24 hours, after that slowly introduce liquids into your diet.  Please schedule follow-up with gastroenterology for repeat colonoscopy, as we discussed there is thickening in your colon which could be consistent with early colon cancer.  You also need repeat imaging to confirm resolution of the diverticulitis.  2-you were positive for trichomoniasis which is a sexually transmitted STD.  The Flagyl you are taking for the diverticulitis will also cure this infection.  3-you have a 5 mm nodule in your lung, you will need repeat imaging in 12 months to make sure it is not growing.  4-unfortunately as we discussed you had contrast extravasation, follow the instructions given to you by CT. you need to return if you have blistering, decree sensation, the swelling is increasing or you have any new concerning symptoms with the arm.  If you are unable to eat or drink, the pain becomes too severe, you have new concerning symptoms return to the ED.

## 2022-07-25 NOTE — ED Notes (Signed)
Patient arm infiltrated in ct

## 2022-07-25 NOTE — ED Triage Notes (Signed)
Pt arrives pov ambulatory c/o LLQ abdominal pain x 1 week. States pain has gotten progressively worse. Describes pain as cramping and pressure that radiates across abdomen. Hx of diverticulitis, reports feels the same.

## 2022-10-13 ENCOUNTER — Other Ambulatory Visit: Payer: Self-pay

## 2022-10-13 ENCOUNTER — Encounter (HOSPITAL_BASED_OUTPATIENT_CLINIC_OR_DEPARTMENT_OTHER): Payer: Self-pay | Admitting: Urology

## 2022-10-13 ENCOUNTER — Emergency Department (HOSPITAL_BASED_OUTPATIENT_CLINIC_OR_DEPARTMENT_OTHER)
Admission: EM | Admit: 2022-10-13 | Discharge: 2022-10-13 | Disposition: A | Discharge status: Home / Self Care | Payer: BC Managed Care – PPO | Attending: Emergency Medicine | Admitting: Emergency Medicine

## 2022-10-13 DIAGNOSIS — H9201 Otalgia, right ear: Secondary | ICD-10-CM | POA: Insufficient documentation

## 2022-10-13 DIAGNOSIS — N611 Abscess of the breast and nipple: Secondary | ICD-10-CM | POA: Insufficient documentation

## 2022-10-13 NOTE — ED Provider Notes (Signed)
[facility] EMERGENCY DEPARTMENT Provider Note   CSN: [id number] Arrival date & time: 10/13/22  1632     History  Chief Complaint  Patient presents with   Mass   Otalgia    Erin Pace is a 56 y.o. female.  56 y.o female with no PMH presents to the ED with a chief complaint of abscess to the left breast, and right ear pain which began yesterday.  Patient reports noticing a "pimple "on her left breast that was not there the day before.  She reports there is pain to the area, tender with palpation and has a blackhead noted.  Patient recently finished imaging such as breast ultrasound, mammogram with no abnormal findings last month.  Is also endorsing some right ear pain, describing it as a feeling of fullness, was prone to ear infections in the past and is concerned that this may be 1.  No alleviating or exacerbating factors.  No fevers, no drainage or other complaints.   The history is provided by the patient and medical records.  Otalgia Associated symptoms: no fever        Home Medications Prior to Admission medications   Medication Sig Start Date End Date Taking? Authorizing Provider  cefdinir (OMNICEF) 300 MG capsule Take 1 capsule (300 mg total) by mouth 2 (two) times daily. 07/25/22   Theron Arista, PA-C  cyclobenzaprine (FLEXERIL) 10 MG tablet Take 0.5-1 tablets (5-10 mg total) by mouth 2 (two) times daily as needed for muscle spasms. 11/14/21   Arthor Captain, PA-C  dicyclomine (BENTYL) 20 MG tablet Take 1 tablet (20 mg total) by mouth 2 (two) times daily. 07/25/22   Theron Arista, PA-C  metroNIDAZOLE (FLAGYL) 500 MG tablet Take 1 tablet (500 mg total) by mouth 2 (two) times daily. 07/25/22   Theron Arista, PA-C  naproxen (NAPROSYN) 375 MG tablet Take 1 tablet (375 mg total) by mouth 2 (two) times daily with a meal. 11/14/21   Arthor Captain, PA-C      Allergies    Ciprofloxacin and Sulfa antibiotics    Review of Systems   Review of Systems  Constitutional:   Negative for fever.  HENT:  Positive for ear pain.   Skin:  Positive for wound.    Physical Exam Updated Vital Signs BP (!) 172/92 (BP Location: Right Arm)   Pulse 90   Temp 98.6 F (37 C) (Oral)   Resp 18   Ht 5\' 2"  (1.575 m)   Wt 54.4 kg   SpO2 98%   BMI 21.94 kg/m  Physical Exam Vitals and nursing note reviewed.  Constitutional:      Appearance: Normal appearance.  HENT:     Head: Normocephalic and atraumatic.     Right Ear: Tympanic membrane normal. There is no impacted cerumen.     Left Ear: Tympanic membrane normal. There is no impacted cerumen.     Mouth/Throat:     Mouth: Mucous membranes are moist.  Cardiovascular:     Rate and Rhythm: Normal rate.  Pulmonary:     Effort: Pulmonary effort is normal.  Chest:    Abdominal:     General: Abdomen is flat.     Tenderness: There is no abdominal tenderness.  Musculoskeletal:     Cervical back: Normal range of motion and neck supple.  Skin:    General: Skin is warm and dry.  Neurological:     Mental Status: She is alert and oriented to person, place, and time.  ED Results / Procedures / Treatments   Labs (all labs ordered are listed, but only abnormal results are displayed) Labs Reviewed - No data to display  EKG None  Radiology No results found.  Procedures Procedures    Medications Ordered in ED Medications - No data to display  ED Course/ Medical Decision Making/ A&P                           Medical Decision Making   Presents to the ED with 2 complaints.  Her first complaint is a left abscess that she noted on her breast yesterday.  This appears tender to palpation, with no streaking in the skin to suggest infection.  Does have a blackhead present, she reports recently having a mammogram, ultrasound of her breast which did not have any acute findings.  I do suspect that this is likely superficial pimple to her left breast.  Discussed warm compresses to help with her symptoms.  Can  complaint is right ear pain, reports feeling a sensation of fullness, was told that she should not be cleaning out her ears.  During evaluation there is no erythema, no injection, significant cerumen noted but no impaction, pain with palpation of the tragus.  No pain behind the ear to suggest mastoiditis.  Do not feel that this needs treatment at this time.  According to chart review patient was discharged recently on Omnicef.  We discussed appropriate follow-up with primary care.  Patient is discharged from the ED in stable condition.   Portions of this note were generated with Scientist, clinical (histocompatibility and immunogenetics). Dictation errors may occur despite best attempts at proofreading.   Final Clinical Impression(s) / ED Diagnoses Final diagnoses:  Right ear pain  Abscess of left breast    Rx / DC Orders ED Discharge Orders     None         Claude Manges, PA-C 10/13/22 1802    Charlynne Pander, MD 10/13/22 [phone]

## 2022-10-13 NOTE — ED Triage Notes (Signed)
Pt states noticed small lump/reddened area to left breast x 3 days, states had black head prior to the swelling  Also states left ear pain as well for approx 10 days

## 2022-10-13 NOTE — Discharge Instructions (Addendum)
Please follow-up with your primary care physician as needed. °

## 2022-10-13 NOTE — ED Notes (Signed)
D/c paperwork reviewed with pt, including f/u care. Pt verbalized understanding, no questions or concerns at time of d/c. Ambulatory to ED exit without assistance, NAD.  
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
## 2023-10-06 ENCOUNTER — Emergency Department (HOSPITAL_BASED_OUTPATIENT_CLINIC_OR_DEPARTMENT_OTHER)
Admission: EM | Admit: 2023-10-06 | Discharge: 2023-10-06 | Disposition: A | Discharge status: Home / Self Care | Payer: BLUE CROSS/BLUE SHIELD | Attending: Emergency Medicine | Admitting: Emergency Medicine

## 2023-10-06 ENCOUNTER — Emergency Department (HOSPITAL_BASED_OUTPATIENT_CLINIC_OR_DEPARTMENT_OTHER): Payer: BLUE CROSS/BLUE SHIELD

## 2023-10-06 ENCOUNTER — Other Ambulatory Visit: Payer: Self-pay

## 2023-10-06 ENCOUNTER — Other Ambulatory Visit (HOSPITAL_BASED_OUTPATIENT_CLINIC_OR_DEPARTMENT_OTHER): Payer: Self-pay

## 2023-10-06 ENCOUNTER — Encounter (HOSPITAL_BASED_OUTPATIENT_CLINIC_OR_DEPARTMENT_OTHER): Payer: Self-pay | Admitting: Emergency Medicine

## 2023-10-06 DIAGNOSIS — K5732 Diverticulitis of large intestine without perforation or abscess without bleeding: Secondary | ICD-10-CM | POA: Insufficient documentation

## 2023-10-06 DIAGNOSIS — R109 Unspecified abdominal pain: Secondary | ICD-10-CM | POA: Diagnosis present

## 2023-10-06 DIAGNOSIS — K5792 Diverticulitis of intestine, part unspecified, without perforation or abscess without bleeding: Secondary | ICD-10-CM

## 2023-10-06 HISTORY — DX: Diverticulitis of intestine, part unspecified, without perforation or abscess without bleeding: K57.92

## 2023-10-06 LAB — BASIC METABOLIC PANEL
Anion gap: 7 (ref 5–15)
BUN: 12 mg/dL (ref 6–20)
CO2: 29 mmol/L (ref 22–32)
Calcium: 10.1 mg/dL (ref 8.9–10.3)
Chloride: 108 mmol/L (ref 98–111)
Creatinine, Ser: 0.78 mg/dL (ref 0.44–1.00)
GFR, Estimated: >60 mL/min (ref >60)
Glucose, Bld: 93 mg/dL (ref 70–99)
Potassium: 3.3 mmol/L — ABNORMAL LOW (ref 3.5–5.1)
Sodium: 144 mmol/L (ref 135–145)

## 2023-10-06 LAB — CBC WITH DIFFERENTIAL/PLATELET
Abs Immature Granulocytes: 0.02 10*3/uL (ref 0.00–0.07)
Basophils Absolute: 0 10*3/uL (ref 0.0–0.1)
Basophils Relative: 0 %
Eosinophils Absolute: 0 10*3/uL (ref 0.0–0.5)
Eosinophils Relative: 1 %
HCT: 40.2 % (ref 36.0–46.0)
Hemoglobin: 13.4 g/dL (ref 12.0–15.0)
Immature Granulocytes: 0 %
Lymphocytes Relative: 35 %
Lymphs Abs: 2.1 10*3/uL (ref 0.7–4.0)
MCH: 30 pg (ref 26.0–34.0)
MCHC: 33.3 g/dL (ref 30.0–36.0)
MCV: 89.9 fL (ref 80.0–100.0)
Monocytes Absolute: 0.4 10*3/uL (ref 0.1–1.0)
Monocytes Relative: 7 %
Neutro Abs: 3.4 10*3/uL (ref 1.7–7.7)
Neutrophils Relative %: 57 %
Platelets: 308 10*3/uL (ref 150–400)
RBC: 4.47 MIL/uL (ref 3.87–5.11)
RDW: 13.5 % (ref 11.5–15.5)
WBC: 5.9 10*3/uL (ref 4.0–10.5)
nRBC: 0 % (ref 0.0–0.2)

## 2023-10-06 MED ORDER — IOHEXOL 300 MG/ML  SOLN
100.0000 mL | Freq: Once | INTRAMUSCULAR | Status: AC | PRN
  Administered 2023-10-06: 100 mL via INTRAVENOUS

## 2023-10-06 MED ORDER — METRONIDAZOLE 500 MG PO TABS
500.0000 mg | ORAL_TABLET | Freq: Two times a day (BID) | ORAL | 0 refills | Status: AC
  Filled 2023-10-06: qty 14, 7d supply, fill #0

## 2023-10-06 MED ORDER — CEFDINIR 300 MG PO CAPS
300.0000 mg | ORAL_CAPSULE | Freq: Two times a day (BID) | ORAL | 0 refills | Status: AC
  Filled 2023-10-06: qty 14, 7d supply, fill #0

## 2023-10-06 MED ORDER — AMOXICILLIN-POT CLAVULANATE 875-125 MG PO TABS
1.0000 | ORAL_TABLET | Freq: Two times a day (BID) | ORAL | 0 refills | Status: DC
  Filled 2023-10-06: qty 20, 10d supply, fill #0

## 2023-10-06 MED ORDER — IBUPROFEN 800 MG PO TABS
800.0000 mg | ORAL_TABLET | Freq: Three times a day (TID) | ORAL | 0 refills | Status: AC | PRN
  Filled 2023-10-06: qty 10, 4d supply, fill #0

## 2023-10-06 NOTE — ED Provider Notes (Signed)
[place] [facility] DEPARTMENT AT [facility] Provider Note   CSN: [id number] Arrival date & time: 10/06/23  1253     History  Chief Complaint  Patient presents with   Abdominal Pain    Erin Pace is a 57 y.o. female.   Abdominal Pain Patient presents abdominal pain and swelling her neck.  Has had around 3 weeks of diverticulitis.  Had been seen by PCP and outpatient CT scan that showed uncomplicated diverticulitis.-Treating with Augmentin.  Had been doing better began to have more pain in her abdomen again.  Said she thought she may have started to eat too quickly.  No fevers.  Also now having swelling in her neck.  There is firmness bilaterally.    Past Medical History:  Diagnosis Date   Diverticulitis     Home Medications Prior to Admission medications   Medication Sig Start Date End Date Taking? Authorizing Provider  cefdinir (OMNICEF) 300 MG capsule Take 1 capsule (300 mg total) by mouth 2 (two) times daily. 07/25/22   Theron Arista, PA-C  cyclobenzaprine (FLEXERIL) 10 MG tablet Take 0.5-1 tablets (5-10 mg total) by mouth 2 (two) times daily as needed for muscle spasms. 11/14/21   Arthor Captain, PA-C  dicyclomine (BENTYL) 20 MG tablet Take 1 tablet (20 mg total) by mouth 2 (two) times daily. 07/25/22   Theron Arista, PA-C  metroNIDAZOLE (FLAGYL) 500 MG tablet Take 1 tablet (500 mg total) by mouth 2 (two) times daily. 07/25/22   Theron Arista, PA-C  naproxen (NAPROSYN) 375 MG tablet Take 1 tablet (375 mg total) by mouth 2 (two) times daily with a meal. 11/14/21   Arthor Captain, PA-C      Allergies    Ciprofloxacin and Sulfa antibiotics    Review of Systems   Review of Systems  Gastrointestinal:  Positive for abdominal pain.    Physical Exam Updated Vital Signs BP (!) 185/92 (BP Location: Left Arm)   Pulse 94   Temp 98.8 F (37.1 C) (Oral)   Resp 16   Ht 5\' 2"  (1.575 m)   Wt 54.4 kg   SpO2 99%   BMI 21.95 kg/m  Physical Exam Vitals and nursing  note reviewed.  HENT:     Head:     Comments: Firm mass bilaterally submandibular area.  No skin changes. Cardiovascular:     Rate and Rhythm: Normal rate.  Pulmonary:     Breath sounds: Normal breath sounds.  Abdominal:     Tenderness: There is abdominal tenderness.     Comments: Left lower quadrant tenderness without rebound or guarding.  No hernia palpated.  Neurological:     Mental Status: She is alert.     ED Results / Procedures / Treatments   Labs (all labs ordered are listed, but only abnormal results are displayed) Labs Reviewed  CBC WITH DIFFERENTIAL/PLATELET  BASIC METABOLIC PANEL    EKG None  Radiology No results found.  Procedures Procedures    Medications Ordered in ED Medications - No data to display  ED Course/ Medical Decision Making/ A&P                                 Medical Decision Making Amount and/or Complexity of Data Reviewed Labs: ordered. Radiology: ordered.  Risk Prescription drug management.   Patient with worsening abdominal pain.  Has been treated for diverticulitis.  Had CT scan 2 weeks ago that showed uncomplicated diverticulitis.  Will get basic blood work and rescan due to increasing pain.  Also has firm masses in bilateral neck.  It is in the submandibular area.  Does have smoking history.  Will get CT scan of this also to help evaluate.  Blood work reassuring.  White count reassuring.  Care turned over to oncoming provider.        Final Clinical Impression(s) / ED Diagnoses Final diagnoses:  None    Rx / DC Orders ED Discharge Orders     None         Benjiman Core, MD 10/06/23 1436

## 2023-10-06 NOTE — ED Notes (Signed)

## 2023-10-06 NOTE — Discharge Instructions (Addendum)
Your CT scan is concerning for another episode of diverticulitis.  You were placed back on antibiotics.  You should follow-up with your doctor early next week.  Your CT scan also showed some enlarged submandibular glands, you need to follow-up with your doctor about this.  If you develop fever, severe abdominal pain, difficulty eating or drinking or going to the bathroom you should return to the ED.

## 2023-10-06 NOTE — ED Triage Notes (Signed)
States dx with diverticulitis 3 weeks ago and has been taking her meds finished  on sat, now she is still having the pain  left side of abd and has a lump on the rt side of her neck noticed it on monday

## 2023-10-06 NOTE — ED Provider Notes (Signed)
57 year old with recent diverticulitis presenting for recurrent left lower quadrant pain as well as some bilateral anterior cervical swelling.  Handoff from prior provider with plan to follow-up CT neck and abdomen and reassess.  Lab work reassuring.  CT scan with persistent but improved diverticulitis.  No complication or abscess.  Also has prominent submandibular gland but no signs of abscess or infection of the neck.  Discussed with pharmacy given recent course of Augmentin with recurrent symptoms.  Symptoms did improve on Augmentin.  Unfortunate she is allergic to sulfa and Cipro.  Pharmacy recommending another course of Augmentin or cefdinir and Flagyl.  She has tolerated this before.  Patient did have improvement with this.  Discussed the patient she needs to follow close with her doctor for reevaluation of her diverticulitis as well as the enlarged submandibular glands.  Will discharge with course of cefdinir and Flagyl.   Laurence Spates, MD 10/06/23 2242
